# Patient Record
Sex: Male | Born: 1975
Health system: Southern US, Community
[De-identification: ages and names within clinical notes are randomized; demographics above are authoritative.]

---

## 2015-07-22 DIAGNOSIS — L738 Other specified follicular disorders: Secondary | ICD-10-CM | POA: Diagnosis not present

## 2015-07-22 DIAGNOSIS — L709 Acne, unspecified: Secondary | ICD-10-CM | POA: Diagnosis not present

## 2015-12-03 ENCOUNTER — Emergency Department (HOSPITAL_COMMUNITY)
Admission: EM | Admit: 2015-12-03 | Discharge: 2015-12-03 | Disposition: A | Discharge status: Home / Self Care | Payer: BLUE CROSS/BLUE SHIELD | Attending: Emergency Medicine | Admitting: Emergency Medicine

## 2015-12-03 ENCOUNTER — Emergency Department (HOSPITAL_COMMUNITY): Payer: BLUE CROSS/BLUE SHIELD

## 2015-12-03 ENCOUNTER — Encounter (HOSPITAL_COMMUNITY)
Admission: EM | Disposition: A | Discharge status: Home / Self Care | Payer: Self-pay | Source: Home / Self Care | Attending: Emergency Medicine

## 2015-12-03 DIAGNOSIS — R55 Syncope and collapse: Secondary | ICD-10-CM | POA: Diagnosis not present

## 2015-12-03 DIAGNOSIS — R9431 Abnormal electrocardiogram [ECG] [EKG]: Secondary | ICD-10-CM | POA: Diagnosis not present

## 2015-12-03 DIAGNOSIS — I309 Acute pericarditis, unspecified: Secondary | ICD-10-CM | POA: Diagnosis not present

## 2015-12-03 DIAGNOSIS — R079 Chest pain, unspecified: Secondary | ICD-10-CM | POA: Diagnosis not present

## 2015-12-03 DIAGNOSIS — M62838 Other muscle spasm: Secondary | ICD-10-CM | POA: Diagnosis not present

## 2015-12-03 DIAGNOSIS — R0789 Other chest pain: Secondary | ICD-10-CM | POA: Diagnosis present

## 2015-12-03 LAB — BASIC METABOLIC PANEL
ANION GAP: 10 (ref 5–15)
BUN: 19 mg/dL (ref 6–20)
CALCIUM: 9.6 mg/dL (ref 8.9–10.3)
CO2: 24 mmol/L (ref 22–32)
Chloride: 103 mmol/L (ref 101–111)
Creatinine, Ser: 1.09 mg/dL (ref 0.61–1.24)
GFR calc Af Amer: >60 mL/min (ref >60)
GFR calc non Af Amer: >60 mL/min (ref >60)
GLUCOSE: 105 mg/dL — AB (ref 65–99)
Potassium: 3.9 mmol/L (ref 3.5–5.1)
Sodium: 137 mmol/L (ref 135–145)

## 2015-12-03 LAB — CBC
HEMATOCRIT: 42.7 % (ref 39.0–52.0)
HEMOGLOBIN: 14 g/dL (ref 13.0–17.0)
MCH: 26.5 pg (ref 26.0–34.0)
MCHC: 32.8 g/dL (ref 30.0–36.0)
MCV: 80.7 fL (ref 78.0–100.0)
Platelets: 219 10*3/uL (ref 150–400)
RBC: 5.29 MIL/uL (ref 4.22–5.81)
RDW: 15.7 % — ABNORMAL HIGH (ref 11.5–15.5)
WBC: 9.6 10*3/uL (ref 4.0–10.5)

## 2015-12-03 LAB — I-STAT TROPONIN, ED
TROPONIN I, POC: 0 ng/mL (ref 0.00–0.08)
Troponin i, poc: 0 ng/mL (ref 0.00–0.08)

## 2015-12-03 LAB — D-DIMER, QUANTITATIVE: D-Dimer, Quant: 0.35 ug/mL-FEU (ref 0.00–0.50)

## 2015-12-03 SURGERY — LEFT HEART CATH AND CORONARY ANGIOGRAPHY
Anesthesia: LOCAL

## 2015-12-03 MED ORDER — COLCHICINE 0.6 MG PO TABS
0.6000 mg | ORAL_TABLET | Freq: Once | ORAL | Status: AC
  Administered 2015-12-03: 0.6 mg via ORAL
  Filled 2015-12-03: qty 1

## 2015-12-03 MED ORDER — COLCHICINE 0.6 MG PO TABS: 0.6000 mg | ORAL_TABLET | Freq: Every day | ORAL | 0 refills | Status: AC

## 2015-12-03 MED ORDER — HEPARIN (PORCINE) IN NACL 100-0.45 UNIT/ML-% IJ SOLN
1000.0000 [IU]/h | INTRAMUSCULAR | Status: DC
  Filled 2015-12-03: qty 250

## 2015-12-03 MED ORDER — SODIUM CHLORIDE 0.9 % IV BOLUS (SEPSIS)
500.0000 mL | Freq: Once | INTRAVENOUS | Status: AC
  Administered 2015-12-03: 500 mL via INTRAVENOUS

## 2015-12-03 MED ORDER — KETOROLAC TROMETHAMINE 30 MG/ML IJ SOLN
30.0000 mg | Freq: Once | INTRAMUSCULAR | Status: AC
  Administered 2015-12-03: 30 mg via INTRAVENOUS
  Filled 2015-12-03: qty 1

## 2015-12-03 MED ORDER — INDOMETHACIN 25 MG PO CAPS: 25.0000 mg | ORAL_CAPSULE | Freq: Three times a day (TID) | ORAL | 0 refills | Status: AC | PRN

## 2015-12-03 MED ORDER — HEPARIN SODIUM (PORCINE) 5000 UNIT/ML IJ SOLN
4000.0000 [IU] | Freq: Once | INTRAMUSCULAR | Status: AC
  Administered 2015-12-03: 4000 [IU] via INTRAVENOUS

## 2015-12-03 NOTE — ED Notes (Addendum)
4,000 units heparin given IV  324mg  aspirin at PCP

## 2015-12-03 NOTE — Progress Notes (Signed)
   12/03/15 1800  Clinical Encounter Type  Visited With Patient and family together  Visit Type ED  Referral From Nurse  Consult/Referral To Chaplain  Spiritual Encounters  Spiritual Needs Prayer  CHP spoke with patient and wife. Wife asked to keep husband in my prayers.  Provided ministry of presence. Rodney BoozeGail L Ishan Sanroman 12/03/15

## 2015-12-03 NOTE — Discharge Instructions (Signed)
Avoid strenuous physical activity.  You may start your prescriptions tomorrow morning.

## 2015-12-03 NOTE — ED Notes (Signed)
Wife has patient clothes,shoes and wallet.

## 2015-12-03 NOTE — ED Notes (Signed)
Cardiologist at the bedside does not feel that this is an MI but rather pericarditis

## 2015-12-03 NOTE — ED Notes (Signed)
Pt resting with wife at bedside still stating that chest pain is a 4/10 remains on monitor in no distress

## 2015-12-03 NOTE — Consult Note (Signed)
Martin Watson is an 40 y.o. male.   ChElvera Mariaief Complaint: Chest pain 2 days duration HPI: Martin Watson  is a 40 y.o. male  With no history of hypertension, hyperlipidemia, diabetes mellitus, does not use any illicit drugs, drinks alcoholic occasionally over the weekends, musician by occupation, no history of family premature coronary artery disease or sudden cardiac death, had been doing well until about one week ago to 10 days ago started developing cough, congestion and felt ill and febrile. He recuperated after 3-4 days but still feeling weak. Yesterday started having chest pain described as continuous discomfort. Taking deep breaths or coughing would hurt more, he also states that if he turned around in the bed his chest would hurt.  Last night felt very uneasy and due to chest discomfort, felt like he needs to walk around and also get fresh air, walked outside and felt extremely dizzy and fell to the ground. He doesn't think that he lost his consciousness at that point, but came back inside the house himself and then had an episode of syncope again he states that it lasted just a second or 2. His wife is present, no seizures, no complete loss of consciousness but only transient loss. He did not lose any bowel or bladder disturbances, no post ictal state.  Due to persistent chest discomfort, he presented to the emergency room. EKG was obtained and study was activated and I was called to see the patient. I felt that the EKG was more consistent with acute pericarditis.  No past medical history on file. See above  No past surgical history on file. No prior surgical history  No family history on file. No family history of premature coronary artery disease or diabetes mellitus Social History:  has no tobacco, alcohol, and drug history on file. No tobacco use, result: Occasionally on the weekend, no history of illicit drug use. Married.  Allergies: Allergies not on file  Review of Systems - Negative  except Chest pain, feels fatigue. No nausea or vomiting, no shortness breath, no hemoptysis, no leg edema recent long travel.  Blood pressure 130/88, pulse 116, temperature 98.3 F (36.8 C), temperature source Oral, resp. rate 18, SpO2 100 %. General appearance: alert, cooperative, appears stated age and no distress Eyes: negative findings: lids and lashes normal Neck: no adenopathy, no carotid bruit, no JVD, supple, symmetrical, trachea midline and thyroid not enlarged, symmetric, no tenderness/mass/nodules Neck: JVP - normal, carotids 2+= without bruits Resp: clear to auscultation bilaterally Chest wall: no tenderness Cardio: regular rate and rhythm, S1, S2 normal, no murmur, click, rub or gallop GI: soft, non-tender; bowel sounds normal; no masses,  no organomegaly Extremities: extremities normal, atraumatic, no cyanosis or edema Pulses: 2+ and symmetric Skin: Skin color, texture, turgor normal. No rashes or lesions Neurologic: Grossly normal  Results for orders placed or performed during the hospital encounter of 12/03/15 (from the past 48 hour(s))  CBC     Status: Abnormal   Collection Time: 12/03/15  5:56 PM  Result Value Ref Range   WBC 9.6 4.0 - 10.5 K/uL   RBC 5.29 4.22 - 5.81 MIL/uL   Hemoglobin 14.0 13.0 - 17.0 g/dL   HCT 16.142.7 09.639.0 - 04.552.0 %   MCV 80.7 78.0 - 100.0 fL   MCH 26.5 26.0 - 34.0 pg   MCHC 32.8 30.0 - 36.0 g/dL   RDW 40.915.7 (H) 81.111.5 - 91.415.5 %   Platelets 219 150 - 400 K/uL  I-stat troponin, ED  Status: None   Collection Time: 12/03/15  6:10 PM  Result Value Ref Range   Troponin i, poc 0.00 0.00 - 0.08 ng/mL   Comment 3            Comment: Due to the release kinetics of cTnI, a negative result within the first hours of the onset of symptoms does not rule out myocardial infarction with certainty. If myocardial infarction is still suspected, repeat the test at appropriate intervals.    Dg Chest Port 1 View  Result Date: 12/03/2015 CLINICAL DATA:  Severe  chest pain EXAM: PORTABLE CHEST 1 VIEW COMPARISON:  None. FINDINGS: No acute infiltrate or effusion. Cardiomediastinal silhouette within normal limits. No pneumothorax. Suspect straight edge or linear artifact paralleling the descending aorta, pneumomediastinum is felt unlikely. IMPRESSION: 1. No acute infiltrate or edema 2. Probable linear artifact paralleling the descending thoracic aorta Electronically Signed   By: Jasmine PangKim  Fujinaga M.D.   On: 12/03/2015 18:28    Labs:   Lab Results  Component Value Date   WBC 9.6 12/03/2015   HGB 14.0 12/03/2015   HCT 42.7 12/03/2015   MCV 80.7 12/03/2015   PLT 219 12/03/2015   No results for input(s): NA, K, CL, CO2, BUN, CREATININE, CALCIUM, PROT, BILITOT, ALKPHOS, ALT, AST, GLUCOSE in the last 168 hours.  Invalid input(s): LABALBU  EKG: Sinus tachycardia at rate of 106 bpm, diffuse ST segment elevation with PR segment depression without reciprocal changes suggestive of acute peritonitis..   Current Facility-Administered Medications:  .  sodium chloride 0.9 % bolus 500 mL, 500 mL, Intravenous, Once, Rolland PorterMark James, MD No current outpatient prescriptions on file.  Assessment/Plan 1. Acute peritonitis, bedside echocardiogram reveals hyperdynamic left ventricle without wall motion of the modality.  Recommendation: I do not suspect pulmonary embolism, aortic dissection, chest x-ray was reviewed, no mediastinal enlargement, no cardiomegaly, patient can be discharged home, advised him to rest for the next 3 days. I will see him back in office on Monday or Tuesday. He'll be discharged home on indomethacin and colchicine. Orthostatics were also checked, he was not orthostatic. Hemodynamically stable. Discussed with ED physician, Dr. Vida RiggerMark James  Jefry Lesinski, MD 12/03/2015, 6:31 PM [facility] Pager: [phone] Office: [phone] If no answer: Cell:  [phone]

## 2015-12-03 NOTE — ED Notes (Signed)
[facility] remains awake and alert able to answer questions appropriately

## 2015-12-03 NOTE — ED Triage Notes (Signed)
Pt presents with c/o chest pain. The pain began several days ago, and became worse last night. Last night he had 2 syncopal episodes at home. He reports dizziness. He denies SOB. The pain has been constant since onset. He is alert and breathing easily.

## 2015-12-08 DIAGNOSIS — I300 Acute nonspecific idiopathic pericarditis: Secondary | ICD-10-CM | POA: Diagnosis not present

## 2015-12-11 NOTE — ED Provider Notes (Signed)
[facility]-EMERGENCY DEPT Provider Note   CSN: [id number] Arrival date & time: 12/03/15  1737     History   Chief Complaint Chief Complaint  Patient presents with  . Code STEMI  . Chest Pain    HPI Martin Watson is a 40 y.o. male. Presents with chest pain.  Recesses he is percent at triage because of an abnormal EKG. Left care of another patient to examine and interview Martin Watson paced on his EKG findings.  EKG shows elevations of his ST segments diffusely. Leads 1,2 and aVF. Also V2 through V5 anteriorly. No depressions reciprocal changes noted.  Code STEMI was initially activated. However ultimately after further examination of patient discussion with Dr.Ganji, this was canceled and patient is determined to likely have acute pericarditis.  He describes a respiratory illness with cough and fever about 2 weeks ago present mild persistent cough. The chest and last 2 days it is diffuse primarily anterior. Sharp and nearly constant now. It is worse with position changes and pleuritic. Is not short of breath. Has not had nausea or vomiting.  Patient has no history of hypertension diabetes or cholesterolemia. Denies illicit drug use. He has no decreased factors no family history of cardiac disease.     HPI  No past medical history on file.  There are no active problems to display for this patient.   No past surgical history on file.     Home Medications    Prior to Admission medications   Medication Sig Start Date End Date Taking? Authorizing Provider  colchicine 0.6 MG tablet Take 1 tablet (0.6 mg total) by mouth daily. 12/03/15   Rolland PorterMark Burt Piatek, MD  indomethacin (INDOCIN) 25 MG capsule Take 1 capsule (25 mg total) by mouth 3 (three) times daily as needed. 12/03/15   Rolland PorterMark Amayrani Bennick, MD    Family History No family history on file.  Social History Social History  Substance Use Topics  . Smoking status: Not on file  . Smokeless tobacco: Not on file  . Alcohol use Not on file      Allergies   Patient has no allergy information on record.   Review of Systems Review of Systems  Constitutional: Positive for fever. Negative for appetite change, chills, diaphoresis and fatigue.  HENT: Positive for congestion, rhinorrhea and sore throat. Negative for mouth sores and trouble swallowing.   Eyes: Negative for visual disturbance.  Respiratory: Negative for cough, chest tightness, shortness of breath and wheezing.   Cardiovascular: Positive for chest pain.  Gastrointestinal: Negative for abdominal distention, abdominal pain, diarrhea, nausea and vomiting.  Endocrine: Negative for polydipsia, polyphagia and polyuria.  Genitourinary: Negative for dysuria, frequency and hematuria.  Musculoskeletal: Negative for gait problem.  Skin: Negative for color change, pallor and rash.  Neurological: Negative for dizziness, syncope, light-headedness and headaches.  Hematological: Does not bruise/bleed easily.  Psychiatric/Behavioral: Negative for behavioral problems and confusion.     Physical Exam Updated Vital Signs BP 131/96   Pulse 98   Temp 98.2 F (36.8 C) (Oral)   Resp 21   Wt 170 lb (77.1 kg)   SpO2 100%   Physical Exam  Constitutional: He is oriented to person, place, and time. He appears well-developed and well-nourished. No distress.  HENT:  Head: Normocephalic.  Eyes: Conjunctivae are normal. Pupils are equal, round, and reactive to light. No scleral icterus.  Neck: Normal range of motion. Neck supple. No thyromegaly present.  Cardiovascular: Normal rate and regular rhythm.  Exam reveals no gallop and no  friction rub.   No murmur heard. Heart tones regular. No S3 or 4 gallop noted. No murmurs. No pericardial or pleural friction rub. Patient auscultated in the upright, forward leaning, and left lateral decubitus position. Symmetric upper extremity blood pressures.  Pulmonary/Chest: Effort normal and breath sounds normal. No respiratory distress. He has no  wheezes. He has no rales.  Abdominal: Soft. Bowel sounds are normal. He exhibits no distension. There is no tenderness. There is no rebound.  Musculoskeletal: Normal range of motion.  Neurological: He is alert and oriented to person, place, and time.  Skin: Skin is warm and dry. No rash noted.  Psychiatric: He has a normal mood and affect. His behavior is normal.     ED Treatments / Results  Labs (all labs ordered are listed, but only abnormal results are displayed) Labs Reviewed  BASIC METABOLIC PANEL - Abnormal; Notable for the following:       Result Value   Glucose, Bld 105 (*)    All other components within normal limits  CBC - Abnormal; Notable for the following:    RDW 15.7 (*)    All other components within normal limits  D-DIMER, QUANTITATIVE (NOT AT [facility])  I-STAT TROPOININ, ED  Rosezena SensorI-STAT TROPOININ, ED  Rosezena SensorI-STAT TROPOININ, ED    EKG  EKG Interpretation  Date/Time:  Friday December 03 2015 17:55:34 EDT Ventricular Rate:  127 PR Interval:  158 QRS Duration: 97 QT Interval:  306 QTC Calculation: 445 R Axis:   61 Text Interpretation:  Sinus tachycardia Inferior infarct, acute (LCx) Lateral leads are also involved Confirmed by WARD,  DO, KRISTEN ([id number]) on 12/04/2015 3:31:47 PM       Radiology No results found.  Procedures Procedures (including critical care time)  Medications Ordered in ED Medications  sodium chloride 0.9 % bolus 500 mL (0 mLs Intravenous Stopped 12/03/15 2012)  ketorolac (TORADOL) 30 MG/ML injection 30 mg (30 mg Intravenous Given 12/03/15 2016)  heparin injection 4,000 Units (4,000 Units Intravenous Given 12/03/15 1820)  colchicine tablet 0.6 mg (0.6 mg Oral Given 12/03/15 2110)     Initial Impression / Assessment and Plan / ED Course  I have reviewed the triage vital signs and the nursing notes.  Pertinent labs & imaging results that were available during my care of the patient were reviewed by me and considered in my medical decision making  (see chart for details).  Clinical Course     EKG with diffuse ST segment changes. Cardiac enzymes negative. Given colchicine and anti-inflammatories. Seen by Dr. Jacinto HalimGanji in the emergency room. Appreciate Dr. Verl DickerGanji's input. Patient be discharged home on colchicine, indomethacin, and cardiology follow-up as outlined above.   Final Clinical Impressions(s) / ED Diagnoses   Final diagnoses:  Acute pericarditis, unspecified type    New Prescriptions Discharge Medication List as of 12/03/2015  8:48 PM    START taking these medications   Details  colchicine 0.6 MG tablet Take 1 tablet (0.6 mg total) by mouth daily., Starting Fri 12/03/2015, Print    indomethacin (INDOCIN) 25 MG capsule Take 1 capsule (25 mg total) by mouth 3 (three) times daily as needed., Starting Fri 12/03/2015, Print         Rolland PorterMark Ivie Maese, MD 12/11/15 1402

## 2016-02-14 DIAGNOSIS — L700 Acne vulgaris: Secondary | ICD-10-CM | POA: Diagnosis not present

## 2016-05-04 DIAGNOSIS — R079 Chest pain, unspecified: Secondary | ICD-10-CM | POA: Diagnosis not present

## 2016-05-04 DIAGNOSIS — I319 Disease of pericardium, unspecified: Secondary | ICD-10-CM | POA: Diagnosis not present

## 2016-05-05 DIAGNOSIS — I300 Acute nonspecific idiopathic pericarditis: Secondary | ICD-10-CM | POA: Diagnosis not present

## 2016-05-07 ENCOUNTER — Encounter (HOSPITAL_COMMUNITY): Payer: Self-pay | Admitting: Emergency Medicine

## 2016-05-07 ENCOUNTER — Emergency Department (HOSPITAL_COMMUNITY): Payer: BLUE CROSS/BLUE SHIELD

## 2016-05-07 ENCOUNTER — Emergency Department (HOSPITAL_COMMUNITY)
Admission: EM | Admit: 2016-05-07 | Discharge: 2016-05-07 | Disposition: A | Discharge status: Home / Self Care | Payer: BLUE CROSS/BLUE SHIELD | Attending: Emergency Medicine | Admitting: Emergency Medicine

## 2016-05-07 DIAGNOSIS — I308 Other forms of acute pericarditis: Secondary | ICD-10-CM | POA: Insufficient documentation

## 2016-05-07 DIAGNOSIS — R079 Chest pain, unspecified: Secondary | ICD-10-CM | POA: Diagnosis present

## 2016-05-07 DIAGNOSIS — J90 Pleural effusion, not elsewhere classified: Secondary | ICD-10-CM | POA: Diagnosis not present

## 2016-05-07 LAB — CBC
HEMATOCRIT: 40.7 % (ref 39.0–52.0)
Hemoglobin: 13.4 g/dL (ref 13.0–17.0)
MCH: 26.1 pg (ref 26.0–34.0)
MCHC: 32.9 g/dL (ref 30.0–36.0)
MCV: 79.3 fL (ref 78.0–100.0)
PLATELETS: 191 10*3/uL (ref 150–400)
RBC: 5.13 MIL/uL (ref 4.22–5.81)
RDW: 14.4 % (ref 11.5–15.5)
WBC: 10.4 10*3/uL (ref 4.0–10.5)

## 2016-05-07 LAB — BASIC METABOLIC PANEL
Anion gap: 11 (ref 5–15)
BUN: 13 mg/dL (ref 6–20)
CHLORIDE: 101 mmol/L (ref 101–111)
CO2: 24 mmol/L (ref 22–32)
CREATININE: 1.07 mg/dL (ref 0.61–1.24)
Calcium: 9.3 mg/dL (ref 8.9–10.3)
GFR calc Af Amer: >60 mL/min (ref >60)
GFR calc non Af Amer: >60 mL/min (ref >60)
Glucose, Bld: 111 mg/dL — ABNORMAL HIGH (ref 65–99)
Potassium: 3.9 mmol/L (ref 3.5–5.1)
Sodium: 136 mmol/L (ref 135–145)

## 2016-05-07 LAB — I-STAT TROPONIN, ED: Troponin i, poc: 0 ng/mL (ref 0.00–0.08)

## 2016-05-07 MED ORDER — METHYLPREDNISOLONE SODIUM SUCC 125 MG IJ SOLR
125.0000 mg | Freq: Once | INTRAMUSCULAR | Status: AC
  Administered 2016-05-07: 125 mg via INTRAVENOUS
  Filled 2016-05-07: qty 2

## 2016-05-07 MED ORDER — PREDNISONE 20 MG PO TABS: 40.0000 mg | ORAL_TABLET | Freq: Every day | ORAL | 0 refills | Status: AC

## 2016-05-07 MED ORDER — KETOROLAC TROMETHAMINE 30 MG/ML IJ SOLN
30.0000 mg | Freq: Once | INTRAMUSCULAR | Status: AC
  Administered 2016-05-07: 30 mg via INTRAVENOUS
  Filled 2016-05-07: qty 1

## 2016-05-07 NOTE — ED Triage Notes (Addendum)
Patient complains of sharp left sided chest pain that started 3 days ago.  Patient states he was seen at cardiologists office and diagnosed with pericarditis but is concerned because he did not have any testing done that day other than an EKG.  Patient has history of pericarditis.  Alert and oriented and in no apparent distress at this time.

## 2016-05-07 NOTE — Discharge Instructions (Signed)
Medications: Prednisone  Treatment: Take prednisone once daily as prescribed for 5 days. Continue taking her colchicine and indomethacin as prescribed.  Follow-up: Please follow-up with Dr. Jacinto Halim by calling his office tomorrow to make him aware of your emergency room visit. These return to the emergency department if you develop any new or worsening symptoms.

## 2016-05-07 NOTE — ED Provider Notes (Signed)
[facility]-EMERGENCY DEPT Provider Note   CSN: [id number] Arrival date & time: 05/07/16  0711     History   Chief Complaint Chief Complaint  Patient presents with  . Chest Pain    HPI Martin Watson is a 41 y.o. male with history of pericarditis who presents with a five-day history of chest pain. Patient was seen by his cardiologist, Dr. Jacinto Halim, 2 days ago diagnosed with pericarditis again. Patient first had pericarditis after virus last year. Patient symptoms began suddenly 5 days ago. He describes pain as intermittent sharp and left sided. His pain radiates toward his back His pain is worse with laying down and breathing. It is improved with leaning forward. He denies any shortness of breath. He was started on colchicine and indomethacin by Dr. Jacinto Halim which was helping until his pain got worse last night during the night. Patient denies any recent long trips over 8 hours, surgeries, cancer, new leg pain or swelling. Patient also denies any fever, abdominal pain, nausea, vomiting, urinary symptoms.  HPI  History reviewed. No pertinent past medical history.  There are no active problems to display for this patient.   History reviewed. No pertinent surgical history.     Home Medications    Prior to Admission medications   Medication Sig Start Date End Date Taking? Authorizing Provider  colchicine 0.6 MG tablet Take 1 tablet (0.6 mg total) by mouth daily. 12/03/15   Rolland Porter, MD  indomethacin (INDOCIN) 25 MG capsule Take 1 capsule (25 mg total) by mouth 3 (three) times daily as needed. 12/03/15   Rolland Porter, MD  predniSONE (DELTASONE) 20 MG tablet Take 2 tablets (40 mg total) by mouth daily. 05/07/16 05/12/16  Emi Holes, PA-C    Family History No family history on file.  Social History Social History  Substance Use Topics  . Smoking status: Never Smoker  . Smokeless tobacco: Never Used  . Alcohol use 3.6 oz/week    6 Cans of beer per week     Allergies   Patient has no  allergy information on record.   Review of Systems Review of Systems  Constitutional: Negative for chills and fever.  HENT: Negative for facial swelling and sore throat.   Respiratory: Negative for shortness of breath.   Cardiovascular: Positive for chest pain.  Gastrointestinal: Negative for abdominal pain, nausea and vomiting.  Genitourinary: Negative for dysuria.  Musculoskeletal: Negative for back pain.  Skin: Negative for rash and wound.  Neurological: Negative for headaches.  Psychiatric/Behavioral: The patient is not nervous/anxious.      Physical Exam Updated Vital Signs BP 125/88 (BP Location: Right Arm)   Pulse (!) 109   Temp 98.8 F (37.1 C) (Oral)   Resp (!) 22   SpO2 100%   Physical Exam  Constitutional: He appears well-developed and well-nourished. No distress.  HENT:  Head: Normocephalic and atraumatic.  Mouth/Throat: Oropharynx is clear and moist. No oropharyngeal exudate.  Eyes: Conjunctivae are normal. Pupils are equal, round, and reactive to light. Right eye exhibits no discharge. Left eye exhibits no discharge. No scleral icterus.  Neck: Normal range of motion. Neck supple. No thyromegaly present.  Cardiovascular: Regular rhythm, normal heart sounds and intact distal pulses.  Tachycardia present.  Exam reveals no gallop and no friction rub.   No murmur heard. Pulmonary/Chest: Effort normal and breath sounds normal. No stridor. No respiratory distress. He has no wheezes. He has no rales. He exhibits no tenderness.  Abdominal: Soft. Bowel sounds are normal. He exhibits  no distension. There is no tenderness. There is no rebound and no guarding.  Musculoskeletal: He exhibits no edema.  Lymphadenopathy:    He has no cervical adenopathy.  Neurological: He is alert. Coordination normal.  Skin: Skin is warm and dry. No rash noted. He is not diaphoretic. No pallor.  Psychiatric: He has a normal mood and affect.  Nursing note and vitals reviewed.    ED  Treatments / Results  Labs (all labs ordered are listed, but only abnormal results are displayed) Labs Reviewed  BASIC METABOLIC PANEL - Abnormal; Notable for the following:       Result Value   Glucose, Bld 111 (*)    All other components within normal limits  CBC  I-STAT TROPOININ, ED    EKG  EKG Interpretation  Date/Time:  Sunday May 07 2016 07:18:23 EDT Ventricular Rate:  109 PR Interval:    QRS Duration: 86 QT Interval:  316 QTC Calculation: 426 R Axis:   38 Text Interpretation:  Sinus tachycardia ST-t wave abnormality Abnormal ekg Confirmed by Gerhard Munch  MD ([id number]) on 05/07/2016 7:21:36 AM       Radiology Dg Chest 2 View  Result Date: 05/07/2016 CLINICAL DATA:  CP since Wednesday. Diagnosed with pericarditis on Wednesday. Nonsmoker. No surgery to chest. EXAM: CHEST  2 VIEW COMPARISON:  Chest x-ray dated 12/03/2015. FINDINGS: Heart size and mediastinal contours are normal. Lungs are clear. Lung volumes are normal. Trace left pleural effusion. No pneumothorax seen. Osseous structures about the chest are unremarkable. IMPRESSION: 1. Trace left pleural effusion. Lungs otherwise clear. No evidence of pneumonia or pulmonary edema. 2. Normal heart size. Electronically Signed   By: Bary Richard M.D.   On: 05/07/2016 08:13    Procedures Procedures (including critical care time)  Medications Ordered in ED Medications  ketorolac (TORADOL) 30 MG/ML injection 30 mg (30 mg Intravenous Given 05/07/16 0752)  methylPREDNISolone sodium succinate (SOLU-MEDROL) 125 mg/2 mL injection 125 mg (125 mg Intravenous Given 05/07/16 0752)     Initial Impression / Assessment and Plan / ED Course  I have reviewed the triage vital signs and the nursing notes.  Pertinent labs & imaging results that were available during my care of the patient were reviewed by me and considered in my medical decision making (see chart for details).     Patient with diagnosis of pericarditis at his  cardiologist 2 days ago. Patient was given indomethacin and colchicine, however patient expresses increasing pain overnight. EKG is consistent with pericarditis, as he has diffuse ST elevations in all leads. EKG shows tiny left pleural effusion, however otherwise normal. CBC, BMP unremarkable. Troponin negative. Patient given Toradol and Solu-Medrol the ED with resolution of his pain. We'll discharge home with 5 days of prednisone with continuation of indomethacin and colchicine and follow-up to cardiologist. Return precautions discussed. Patient understands and agrees with plan. Patient discharged in satisfactory condition. Patient also evaluated by Dr. Jeraldine Loots who guided the patient's management and agrees with plan.  Final Clinical Impressions(s) / ED Diagnoses   Final diagnoses:  Other acute pericarditis    New Prescriptions New Prescriptions   PREDNISONE (DELTASONE) 20 MG TABLET    Take 2 tablets (40 mg total) by mouth daily.     Emi Holes, PA-C 05/07/16 4098    Gerhard Munch, MD 05/07/16 [phone]

## 2016-05-26 DIAGNOSIS — R002 Palpitations: Secondary | ICD-10-CM | POA: Diagnosis not present

## 2016-05-26 DIAGNOSIS — I319 Disease of pericardium, unspecified: Secondary | ICD-10-CM | POA: Diagnosis not present

## 2016-06-30 DIAGNOSIS — I319 Disease of pericardium, unspecified: Secondary | ICD-10-CM | POA: Diagnosis not present

## 2016-07-07 DIAGNOSIS — J069 Acute upper respiratory infection, unspecified: Secondary | ICD-10-CM | POA: Diagnosis not present

## 2016-08-01 DIAGNOSIS — L739 Follicular disorder, unspecified: Secondary | ICD-10-CM | POA: Diagnosis not present

## 2016-11-28 DIAGNOSIS — L731 Pseudofolliculitis barbae: Secondary | ICD-10-CM | POA: Diagnosis not present

## 2016-12-27 DIAGNOSIS — I300 Acute nonspecific idiopathic pericarditis: Secondary | ICD-10-CM | POA: Diagnosis not present

## 2017-03-19 DIAGNOSIS — J0140 Acute pansinusitis, unspecified: Secondary | ICD-10-CM | POA: Diagnosis not present

## 2017-12-26 IMAGING — DX DG CHEST 1V PORT
1 series · 1 of 1 positions shown · non-contrast
Comparison: None.

CLINICAL DATA: Severe chest pain

EXAM:
PORTABLE CHEST 1 VIEW

[chest ap]
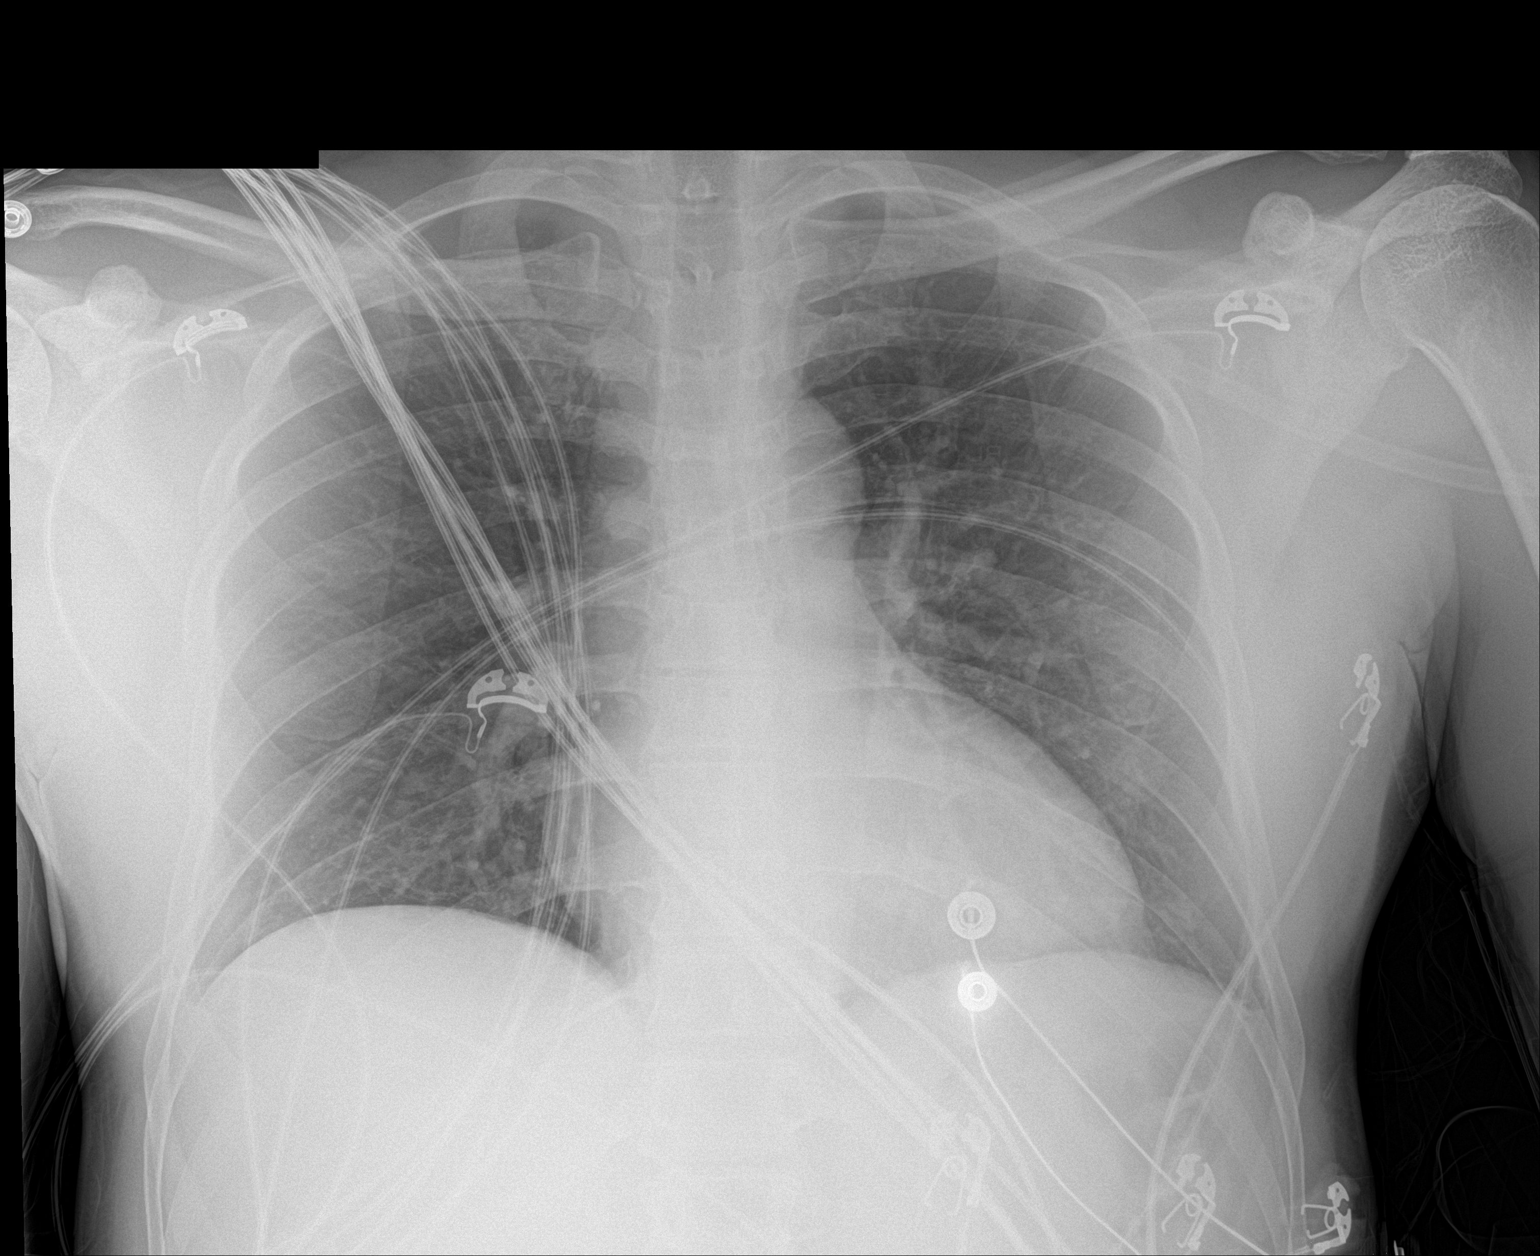

[1 of 1 positions shown; findings below may reference images not displayed]

FINDINGS: No acute infiltrate or effusion. Cardiomediastinal silhouette within
normal limits. No pneumothorax.

Suspect straight edge or linear artifact paralleling the descending
aorta, pneumomediastinum is felt unlikely.
IMPRESSION: 1. No acute infiltrate or edema
2. Probable linear artifact paralleling the descending thoracic
aorta

## 2018-05-31 IMAGING — CR DG CHEST 2V
2 series · 2 of 2 positions shown · non-contrast
Comparison: Chest x-ray dated 12/03/2015.

CLINICAL DATA: CP since [REDACTED]. Diagnosed with pericarditis on
[REDACTED]. Nonsmoker. No surgery to chest.

EXAM:
CHEST  2 VIEW

[chest pa]
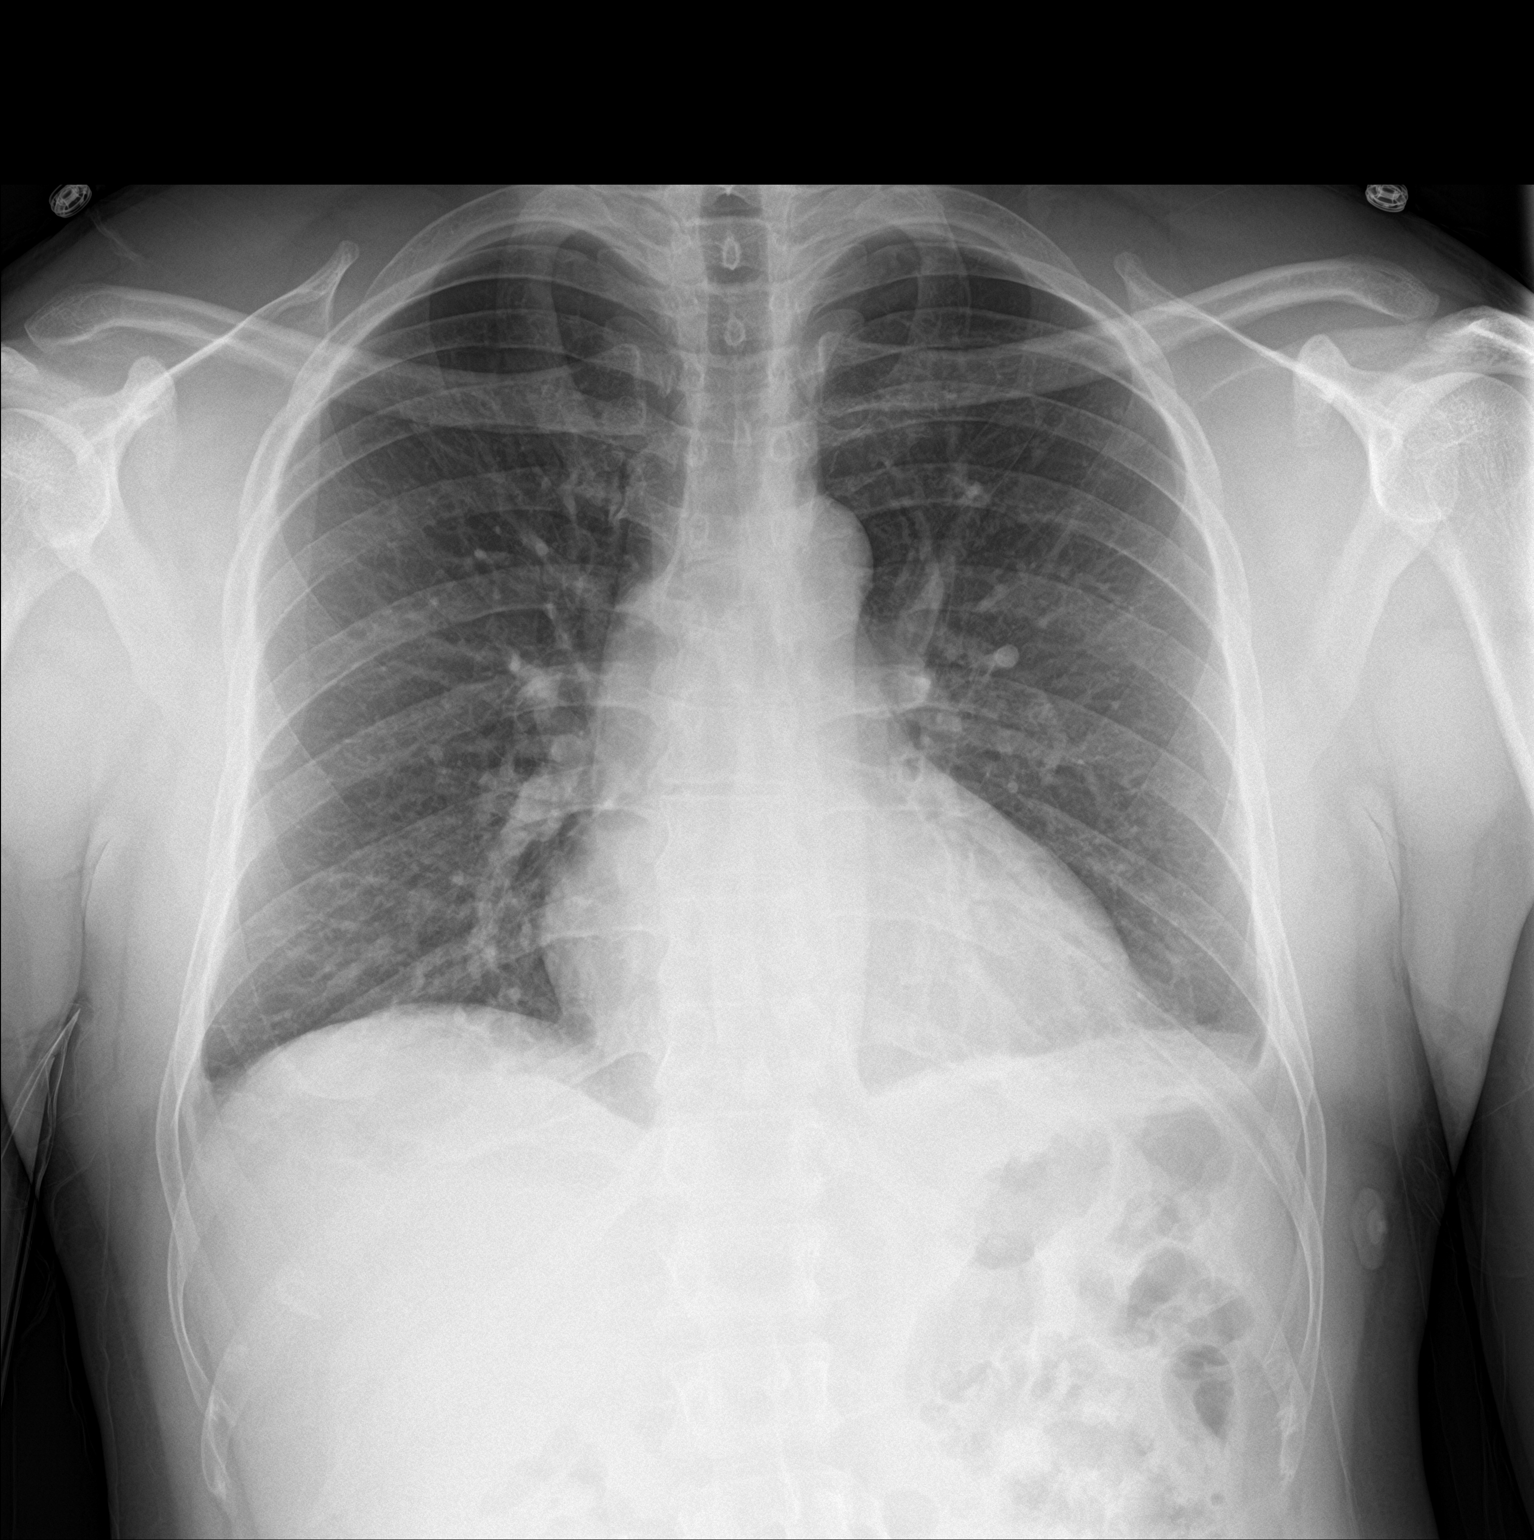

[chest lat]
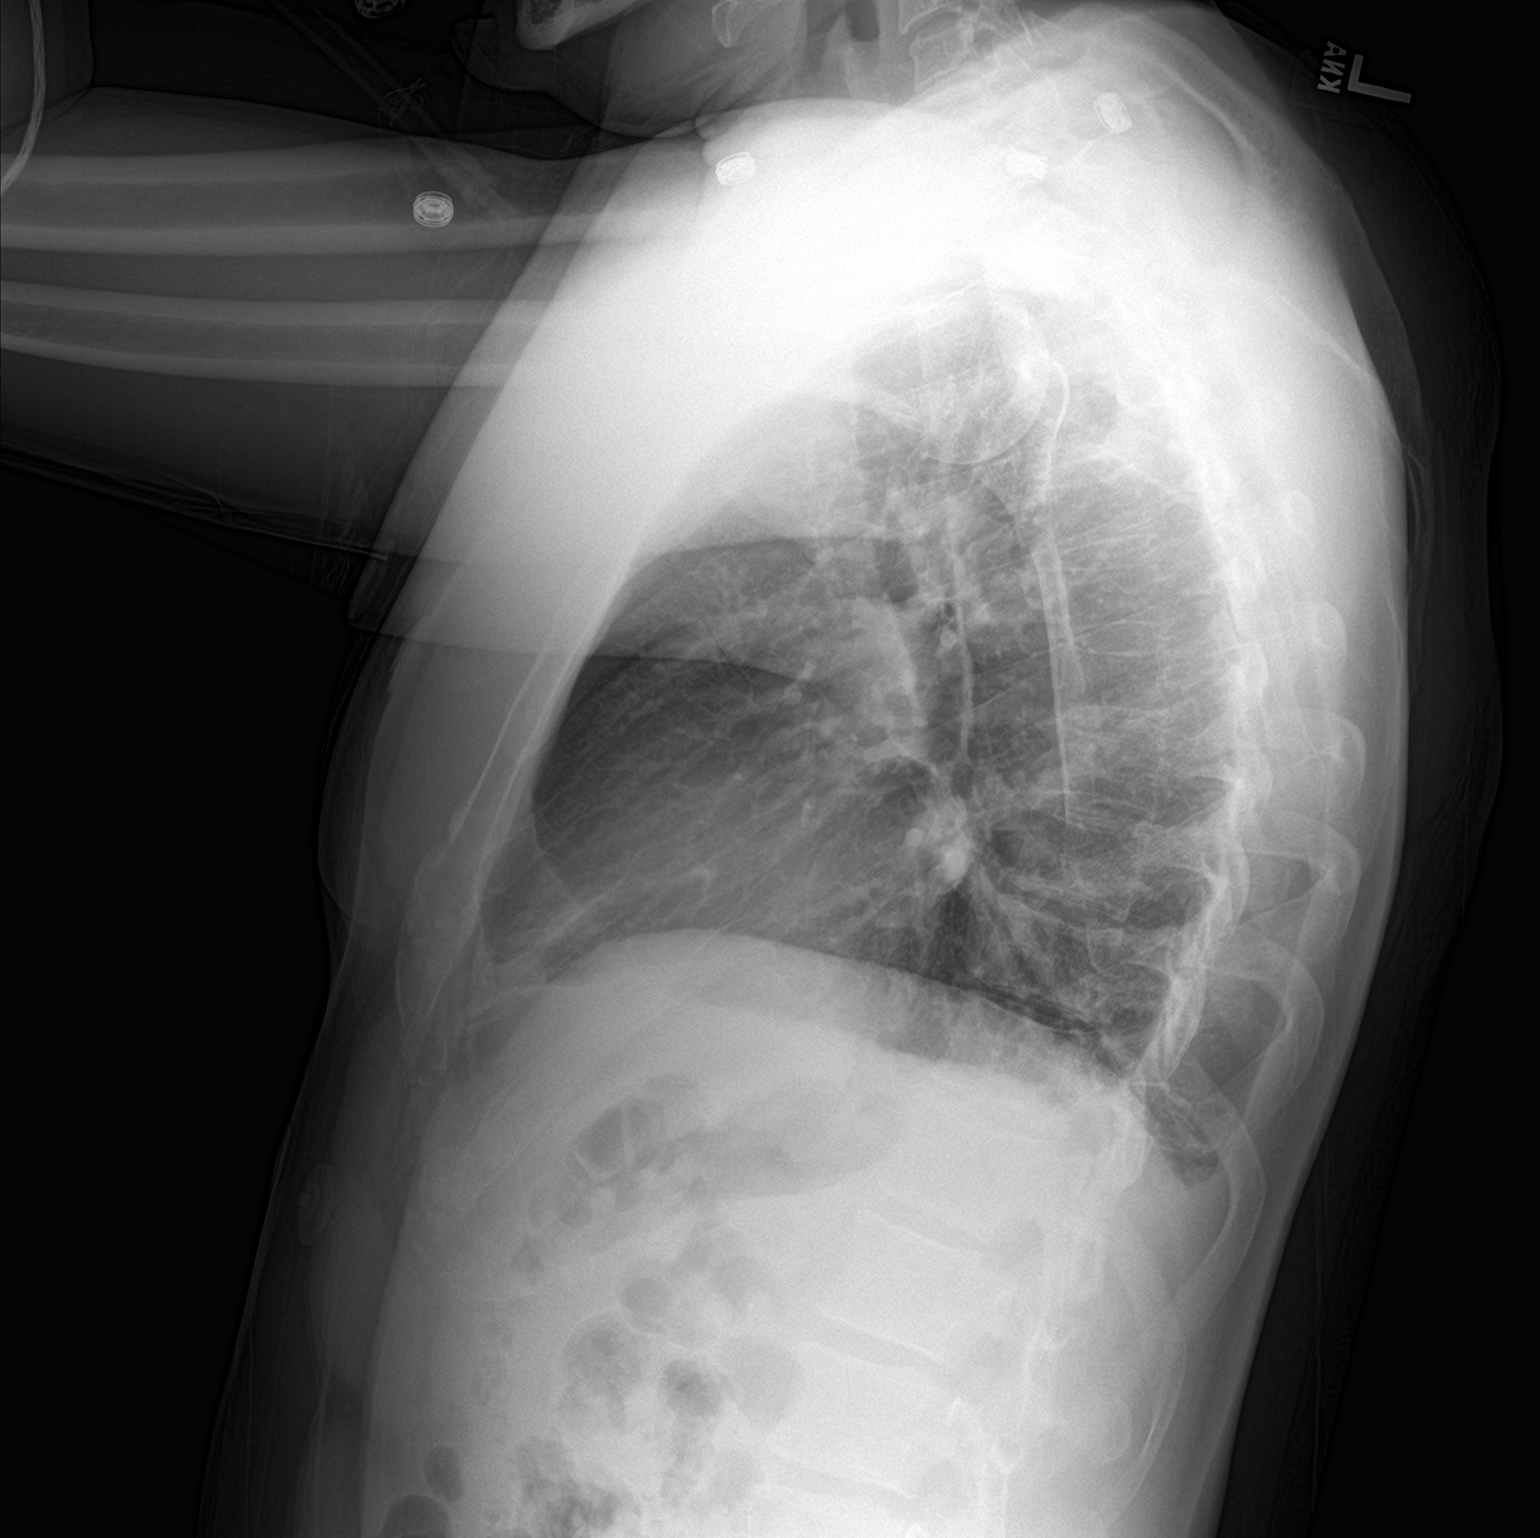

[2 of 2 positions shown; findings below may reference images not displayed]

FINDINGS: Heart size and mediastinal contours are normal. Lungs are clear.
Lung volumes are normal. Trace left pleural effusion. No
pneumothorax seen. Osseous structures about the chest are
unremarkable.
IMPRESSION: 1. Trace left pleural effusion. Lungs otherwise clear. No evidence
of pneumonia or pulmonary edema.
2. Normal heart size.

## 2020-02-09 DIAGNOSIS — R0981 Nasal congestion: Secondary | ICD-10-CM | POA: Diagnosis not present

## 2020-02-09 DIAGNOSIS — I319 Disease of pericardium, unspecified: Secondary | ICD-10-CM | POA: Diagnosis not present

## 2020-02-09 DIAGNOSIS — U071 COVID-19: Secondary | ICD-10-CM | POA: Diagnosis not present

## 2020-02-13 ENCOUNTER — Emergency Department (HOSPITAL_COMMUNITY)
Admission: EM | Admit: 2020-02-13 | Discharge: 2020-02-14 | Disposition: A | Discharge status: Home / Self Care | Payer: BC Managed Care – PPO | Attending: Emergency Medicine | Admitting: Emergency Medicine

## 2020-02-13 ENCOUNTER — Other Ambulatory Visit: Payer: Self-pay

## 2020-02-13 ENCOUNTER — Emergency Department (HOSPITAL_COMMUNITY): Payer: BC Managed Care – PPO

## 2020-02-13 ENCOUNTER — Encounter (HOSPITAL_COMMUNITY): Payer: Self-pay

## 2020-02-13 DIAGNOSIS — I300 Acute nonspecific idiopathic pericarditis: Secondary | ICD-10-CM | POA: Diagnosis not present

## 2020-02-13 DIAGNOSIS — I308 Other forms of acute pericarditis: Secondary | ICD-10-CM | POA: Diagnosis not present

## 2020-02-13 DIAGNOSIS — U071 COVID-19: Secondary | ICD-10-CM | POA: Diagnosis not present

## 2020-02-13 DIAGNOSIS — I319 Disease of pericardium, unspecified: Secondary | ICD-10-CM | POA: Diagnosis not present

## 2020-02-13 DIAGNOSIS — R000 Tachycardia, unspecified: Secondary | ICD-10-CM | POA: Diagnosis not present

## 2020-02-13 DIAGNOSIS — R079 Chest pain, unspecified: Secondary | ICD-10-CM | POA: Diagnosis not present

## 2020-02-13 LAB — BASIC METABOLIC PANEL
Anion gap: 12 (ref 5–15)
BUN: 8 mg/dL (ref 6–20)
CO2: 24 mmol/L (ref 22–32)
Calcium: 10.1 mg/dL (ref 8.9–10.3)
Chloride: 102 mmol/L (ref 98–111)
Creatinine, Ser: 1.17 mg/dL (ref 0.61–1.24)
GFR, Estimated: >60 mL/min (ref >60)
Glucose, Bld: 98 mg/dL (ref 70–99)
Potassium: 4 mmol/L (ref 3.5–5.1)
Sodium: 138 mmol/L (ref 135–145)

## 2020-02-13 LAB — TROPONIN I (HIGH SENSITIVITY)
Troponin I (High Sensitivity): 3 ng/L (ref <18)
Troponin I (High Sensitivity): 3 ng/L (ref <18)

## 2020-02-13 LAB — CBC
HCT: 49.3 % (ref 39.0–52.0)
Hemoglobin: 15.3 g/dL (ref 13.0–17.0)
MCH: 25.3 pg — ABNORMAL LOW (ref 26.0–34.0)
MCHC: 31 g/dL (ref 30.0–36.0)
MCV: 81.6 fL (ref 80.0–100.0)
Platelets: 231 10*3/uL (ref 150–400)
RBC: 6.04 MIL/uL — ABNORMAL HIGH (ref 4.22–5.81)
RDW: 15.1 % (ref 11.5–15.5)
WBC: 5 10*3/uL (ref 4.0–10.5)
nRBC: 0 % (ref 0.0–0.2)

## 2020-02-13 LAB — LACTIC ACID, PLASMA: Lactic Acid, Venous: 1.5 mmol/L (ref 0.5–1.9)

## 2020-02-13 NOTE — ED Triage Notes (Signed)
Pt tested positive for covid on Monday. Reports mild chest pain for the past 3 days, hx of pericarditis since 2017. Does not follow up with a cardiologist regularly. Denies any sob. Pt a.o, resp e.u

## 2020-02-14 NOTE — ED Notes (Addendum)
Tech ambulated pt oxygen went from 96 to 100 and stayed steady

## 2020-02-14 NOTE — Discharge Instructions (Addendum)
Thank you for allowing me to care for you today in the Emergency Department.   Since your symptoms started on Monday, this is day 0. (Then day 1, 2, 3, 4, 5... Etc.) after day 5, if your symptoms are improving then you no longer need to quarantine, but you should wear a mask if you leave the house and try to social distance with other individuals as you may still be contagious until day 10.  After day 10 if symptoms are improving and you are not needing fever reducing medications for more than 24 hours, then your isolation period is complete.  I suspect that you have developing pericarditis given your symptoms.  Start taking indomethacin as prescribed.  You can follow-up with your team at [facility].  You can continue to treat your COVID-19 symptoms with over-the-counter medication as needed.  Do not take any other NSAID medications while you are taking indomethacin.  NSAID medications include Motrin and ibuprofen.  Return to the emergency department if you develop respiratory distress, if you pass out, if you become very sleepy and hard to wake up, or if you have other new, concerning symptoms.

## 2020-02-14 NOTE — ED Provider Notes (Signed)
[facility] EMERGENCY DEPARTMENT Provider Note   CSN: [id number] Arrival date & time: 02/13/20  1649     History Chief Complaint  Patient presents with  . Covid Positive  . Chest Pain    Martin Watson is a 45 y.o. male with a history of idiopathic pericarditis who presents to the emergency department with a chief complaint of chest pain.  The patient reports that he developed URI symptoms on January 9 and tested positive for COVID-19 on January 10.  Symptoms appear to be in proving until 3 days ago when he developed sharp, left-sided chest pain that radiates to his back.  Pain is worse with laying flat and improved with sitting upright and leaning forward.  He has had 2 previous episodes of idiopathic pericarditis and states that his current symptoms feel similar to early presentations of previous episodes of pericarditis.  Pain is not pleuritic or exertional.  He denies palpitations, shortness of breath, dizziness, lightheadedness, syncope, leg swelling, chest pain, fever.  He has had a nonproductive cough and fatigue, but states that the symptoms have been improving.   He was seen at urgent care, but due to the pain, he was advised to follow-up with the ER for further evaluation.  He reports that he has a refill of his home indomethacin at home, but has not taken the medication because he did not know if he could take it with his COVID-19 infection.   The history is provided by the patient and medical records. No language interpreter was used.       History reviewed. No pertinent past medical history.  There are no problems to display for this patient.   History reviewed. No pertinent surgical history.     No family history on file.  Social History   Tobacco Use  . Smoking status: Never Smoker  . Smokeless tobacco: Never Used  Substance Use Topics  . Alcohol use: Yes    Alcohol/week: 6.0 standard drinks    Types: 6 Cans of beer per week  . Drug use:  No    Home Medications Prior to Admission medications   Medication Sig Start Date End Date Taking? Authorizing Provider  colchicine 0.6 MG tablet Take 1 tablet (0.6 mg total) by mouth daily. 12/03/15   Rolland Porter, MD  indomethacin (INDOCIN) 25 MG capsule Take 1 capsule (25 mg total) by mouth 3 (three) times daily as needed. 12/03/15   Rolland Porter, MD    Allergies    Patient has no allergy information on record.  Review of Systems   Review of Systems  Constitutional: Positive for fatigue. Negative for appetite change and fever.  HENT: Positive for congestion. Negative for sore throat.   Eyes: Negative for visual disturbance.  Respiratory: Positive for cough. Negative for choking, shortness of breath and wheezing.   Cardiovascular: Positive for chest pain. Negative for palpitations and leg swelling.  Gastrointestinal: Negative for abdominal pain, constipation, diarrhea, nausea and vomiting.  Genitourinary: Negative for dysuria.  Musculoskeletal: Negative for back pain, myalgias, neck pain and neck stiffness.  Skin: Negative for rash.  Allergic/Immunologic: Negative for immunocompromised state.  Neurological: Negative for seizures, syncope, weakness, numbness and headaches.  Psychiatric/Behavioral: Negative for confusion.    Physical Exam Updated Vital Signs BP (!) 131/120   Pulse 76   Temp 98.2 F (36.8 C) (Oral)   Resp 14   Ht 5\' 11"  (1.803 m)   Wt 83.9 kg   SpO2 100%   BMI 25.80 kg/m  Physical Exam Vitals and nursing note reviewed.  Constitutional:      General: He is not in acute distress.    Appearance: He is well-developed. He is not ill-appearing or toxic-appearing.  HENT:     Head: Normocephalic.  Eyes:     Conjunctiva/sclera: Conjunctivae normal.  Cardiovascular:     Rate and Rhythm: Normal rate and regular rhythm.     Pulses: Normal pulses.     Heart sounds: Normal heart sounds. No murmur heard. No friction rub. No gallop.   Pulmonary:     Effort:  Pulmonary effort is normal. No respiratory distress.     Breath sounds: No stridor. No wheezing, rhonchi or rales.     Comments: Lungs are clear to auscultation bilaterally.  No increased work of breathing. Chest:     Chest wall: No tenderness.  Abdominal:     General: There is no distension.     Palpations: Abdomen is soft. There is no mass.     Tenderness: There is no abdominal tenderness. There is no right CVA tenderness, left CVA tenderness, guarding or rebound.     Hernia: No hernia is present.  Musculoskeletal:     Cervical back: Neck supple.     Right lower leg: No edema.     Left lower leg: No edema.  Skin:    General: Skin is warm and dry.     Capillary Refill: Capillary refill takes less than 2 seconds.     Coloration: Skin is not pale.  Neurological:     Mental Status: He is alert.  Psychiatric:        Behavior: Behavior normal.     ED Results / Procedures / Treatments   Labs (all labs ordered are listed, but only abnormal results are displayed) Labs Reviewed  CBC - Abnormal; Notable for the following components:      Result Value   RBC 6.04 (*)    MCH 25.3 (*)    All other components within normal limits  BASIC METABOLIC PANEL  LACTIC ACID, PLASMA  TROPONIN I (HIGH SENSITIVITY)  TROPONIN I (HIGH SENSITIVITY)    EKG EKG Interpretation  Date/Time:  Friday February 13 2020 17:07:38 EST Ventricular Rate:  120 PR Interval:  164 QRS Duration: 92 QT Interval:  314 QTC Calculation: 443 R Axis:   35 Text Interpretation: Sinus tachycardia Right atrial enlargement Septal infarct , age undetermined T wave abnormality, consider inferior ischemia Abnormal ECG ST elevations have resolved, now with non specifc T waves Confirmed by Eber Hong ([id number]) on 02/13/2020 9:30:47 PM   Radiology DG Chest Portable 1 View  Result Date: 02/13/2020 CLINICAL DATA:  Chest pain in a COVID-19 positive patient. History of pericarditis. EXAM: PORTABLE CHEST 1 VIEW COMPARISON:  PA and  lateral chest 05/07/2016. FINDINGS: Lungs clear. Heart size normal. No pneumothorax or pleural fluid. No bony abnormality. IMPRESSION: Normal exam. Electronically Signed   By: Drusilla Kanner M.D.   On: 02/13/2020 17:58    Procedures Procedures (including critical care time)  Medications Ordered in ED Medications - No data to display  ED Course  I have reviewed the triage vital signs and the nursing notes.  Pertinent labs & imaging results that were available during my care of the patient were reviewed by me and considered in my medical decision making (see chart for details).    MDM Rules/Calculators/A&P  46 year old male with a history of idiopathic pericarditis and COVID-19 who presents the emergency department with a chief complaint of chest pain for the last 3 days after he tested positive for COVID-19 earlier this week.  Patient has a history of idiopathic pericarditis and previously developed episodes secondary to viral infections.  States that his symptoms today feel like early previous episodes of pericarditis.  The patient was discussed with Dr. Eudelia Bunch, attending physician.  Labs and imaging have been reviewed and independently interpreted by me.  Chest x-ray is unremarkable.  Delta troponin is not elevated.  Presentation is very atypical for ACS, PE, aortic dissection.  Also less suspicious for peptic peptic ulcer disease or pancreatitis.  He has no metabolic derangements.  EKG with improved ST segment elevation from previous, but he does appear to have some residual ST segment elevation in the anterior chest leads, which could be suggestive of early pericarditis.  Patient does have a prescription of indomethacin at home, which I encouraged him to initiate.  We will avoid prednisone and colchicine.  He has been advised to follow-up with his vascular surgery team at [facility] as needed.  All questions answered.  ER return precautions given.  He is  hemodynamically stable no acute distress.  Safer discharge home with outpatient follow-up as indicated.  Final Clinical Impression(s) / ED Diagnoses Final diagnoses:  COVID-19  Acute idiopathic pericarditis    Rx / DC Orders ED Discharge Orders    None       Barkley Boards, PA-C 02/14/20 [id number]    Nira Conn, MD 02/15/20 1814

## 2020-03-18 DIAGNOSIS — R000 Tachycardia, unspecified: Secondary | ICD-10-CM | POA: Diagnosis not present

## 2020-03-18 DIAGNOSIS — Z8679 Personal history of other diseases of the circulatory system: Secondary | ICD-10-CM | POA: Diagnosis not present

## 2020-03-22 DIAGNOSIS — Z8679 Personal history of other diseases of the circulatory system: Secondary | ICD-10-CM | POA: Diagnosis not present

## 2020-05-03 DIAGNOSIS — R000 Tachycardia, unspecified: Secondary | ICD-10-CM | POA: Diagnosis not present

## 2020-07-15 DIAGNOSIS — Z20828 Contact with and (suspected) exposure to other viral communicable diseases: Secondary | ICD-10-CM | POA: Diagnosis not present

## 2020-07-15 DIAGNOSIS — R059 Cough, unspecified: Secondary | ICD-10-CM | POA: Diagnosis not present

## 2020-07-15 DIAGNOSIS — Z03818 Encounter for observation for suspected exposure to other biological agents ruled out: Secondary | ICD-10-CM | POA: Diagnosis not present

## 2020-07-15 DIAGNOSIS — J069 Acute upper respiratory infection, unspecified: Secondary | ICD-10-CM | POA: Diagnosis not present

## 2020-07-21 DIAGNOSIS — U071 COVID-19: Secondary | ICD-10-CM | POA: Diagnosis not present

## 2020-07-21 DIAGNOSIS — R0981 Nasal congestion: Secondary | ICD-10-CM | POA: Diagnosis not present

## 2020-07-21 DIAGNOSIS — J0100 Acute maxillary sinusitis, unspecified: Secondary | ICD-10-CM | POA: Diagnosis not present

## 2020-07-21 DIAGNOSIS — R059 Cough, unspecified: Secondary | ICD-10-CM | POA: Diagnosis not present

## 2020-08-20 DIAGNOSIS — E7800 Pure hypercholesterolemia, unspecified: Secondary | ICD-10-CM | POA: Diagnosis not present

## 2020-08-20 DIAGNOSIS — Z0000 Encounter for general adult medical examination without abnormal findings: Secondary | ICD-10-CM | POA: Diagnosis not present

## 2020-08-20 DIAGNOSIS — R7301 Impaired fasting glucose: Secondary | ICD-10-CM | POA: Diagnosis not present

## 2020-08-24 DIAGNOSIS — Z0000 Encounter for general adult medical examination without abnormal findings: Secondary | ICD-10-CM | POA: Diagnosis not present

## 2021-07-11 DIAGNOSIS — J029 Acute pharyngitis, unspecified: Secondary | ICD-10-CM | POA: Diagnosis not present

## 2021-07-11 DIAGNOSIS — B349 Viral infection, unspecified: Secondary | ICD-10-CM | POA: Diagnosis not present

## 2021-09-14 DIAGNOSIS — L089 Local infection of the skin and subcutaneous tissue, unspecified: Secondary | ICD-10-CM | POA: Diagnosis not present

## 2021-09-21 DIAGNOSIS — E559 Vitamin D deficiency, unspecified: Secondary | ICD-10-CM | POA: Diagnosis not present

## 2021-09-21 DIAGNOSIS — E7800 Pure hypercholesterolemia, unspecified: Secondary | ICD-10-CM | POA: Diagnosis not present

## 2021-09-21 DIAGNOSIS — L739 Follicular disorder, unspecified: Secondary | ICD-10-CM | POA: Diagnosis not present

## 2021-09-21 DIAGNOSIS — R7303 Prediabetes: Secondary | ICD-10-CM | POA: Diagnosis not present

## 2021-09-21 DIAGNOSIS — Z0000 Encounter for general adult medical examination without abnormal findings: Secondary | ICD-10-CM | POA: Diagnosis not present

## 2021-09-21 DIAGNOSIS — Z125 Encounter for screening for malignant neoplasm of prostate: Secondary | ICD-10-CM | POA: Diagnosis not present

## 2022-03-01 NOTE — Progress Notes (Signed)
This encounter was created in error - please disregard.

## 2022-06-14 DIAGNOSIS — L089 Local infection of the skin and subcutaneous tissue, unspecified: Secondary | ICD-10-CM | POA: Diagnosis not present

## 2022-10-11 DIAGNOSIS — Z8679 Personal history of other diseases of the circulatory system: Secondary | ICD-10-CM | POA: Diagnosis not present

## 2022-10-11 DIAGNOSIS — Z125 Encounter for screening for malignant neoplasm of prostate: Secondary | ICD-10-CM | POA: Diagnosis not present

## 2022-10-11 DIAGNOSIS — Z0000 Encounter for general adult medical examination without abnormal findings: Secondary | ICD-10-CM | POA: Diagnosis not present

## 2022-10-11 DIAGNOSIS — R7303 Prediabetes: Secondary | ICD-10-CM | POA: Diagnosis not present

## 2022-10-11 DIAGNOSIS — E7800 Pure hypercholesterolemia, unspecified: Secondary | ICD-10-CM | POA: Diagnosis not present

## 2022-10-11 DIAGNOSIS — E559 Vitamin D deficiency, unspecified: Secondary | ICD-10-CM | POA: Diagnosis not present

## 2022-10-13 DIAGNOSIS — Z0000 Encounter for general adult medical examination without abnormal findings: Secondary | ICD-10-CM | POA: Diagnosis not present

## 2022-10-24 DIAGNOSIS — L089 Local infection of the skin and subcutaneous tissue, unspecified: Secondary | ICD-10-CM | POA: Diagnosis not present

## 2022-10-24 DIAGNOSIS — E7800 Pure hypercholesterolemia, unspecified: Secondary | ICD-10-CM | POA: Diagnosis not present

## 2022-10-25 ENCOUNTER — Other Ambulatory Visit (HOSPITAL_COMMUNITY): Payer: Self-pay | Admitting: Family Medicine

## 2022-10-25 DIAGNOSIS — E7800 Pure hypercholesterolemia, unspecified: Secondary | ICD-10-CM

## 2022-11-20 ENCOUNTER — Ambulatory Visit (HOSPITAL_BASED_OUTPATIENT_CLINIC_OR_DEPARTMENT_OTHER)
Admission: RE | Admit: 2022-11-20 | Discharge: 2022-11-20 | Disposition: A | Discharge status: Home / Self Care | Payer: BLUE CROSS/BLUE SHIELD | Source: Ambulatory Visit | Attending: Family Medicine | Admitting: Family Medicine

## 2022-11-20 DIAGNOSIS — E7800 Pure hypercholesterolemia, unspecified: Secondary | ICD-10-CM | POA: Insufficient documentation
